# Patient Record
Sex: Male | Born: 1945
Health system: Southern US, Community
[De-identification: ages and names within clinical notes are randomized; demographics above are authoritative.]

---

## 2012-02-09 DIAGNOSIS — E039 Hypothyroidism, unspecified: Secondary | ICD-10-CM | POA: Diagnosis not present

## 2012-02-09 DIAGNOSIS — R7309 Other abnormal glucose: Secondary | ICD-10-CM | POA: Diagnosis not present

## 2012-02-09 DIAGNOSIS — E782 Mixed hyperlipidemia: Secondary | ICD-10-CM | POA: Diagnosis not present

## 2012-02-13 DIAGNOSIS — R030 Elevated blood-pressure reading, without diagnosis of hypertension: Secondary | ICD-10-CM | POA: Diagnosis not present

## 2012-02-13 DIAGNOSIS — E039 Hypothyroidism, unspecified: Secondary | ICD-10-CM | POA: Diagnosis not present

## 2012-02-13 DIAGNOSIS — J309 Allergic rhinitis, unspecified: Secondary | ICD-10-CM | POA: Diagnosis not present

## 2012-02-13 DIAGNOSIS — E782 Mixed hyperlipidemia: Secondary | ICD-10-CM | POA: Diagnosis not present

## 2012-02-13 DIAGNOSIS — K219 Gastro-esophageal reflux disease without esophagitis: Secondary | ICD-10-CM | POA: Diagnosis not present

## 2012-02-13 DIAGNOSIS — R7309 Other abnormal glucose: Secondary | ICD-10-CM | POA: Diagnosis not present

## 2012-02-13 DIAGNOSIS — M1990 Unspecified osteoarthritis, unspecified site: Secondary | ICD-10-CM | POA: Diagnosis not present

## 2012-03-18 DIAGNOSIS — I491 Atrial premature depolarization: Secondary | ICD-10-CM | POA: Diagnosis not present

## 2012-03-18 DIAGNOSIS — R7309 Other abnormal glucose: Secondary | ICD-10-CM | POA: Diagnosis not present

## 2012-03-18 DIAGNOSIS — I10 Essential (primary) hypertension: Secondary | ICD-10-CM | POA: Diagnosis not present

## 2012-03-18 DIAGNOSIS — E039 Hypothyroidism, unspecified: Secondary | ICD-10-CM | POA: Diagnosis not present

## 2012-03-18 DIAGNOSIS — J309 Allergic rhinitis, unspecified: Secondary | ICD-10-CM | POA: Diagnosis not present

## 2012-03-18 DIAGNOSIS — M1990 Unspecified osteoarthritis, unspecified site: Secondary | ICD-10-CM | POA: Diagnosis not present

## 2012-03-18 DIAGNOSIS — E782 Mixed hyperlipidemia: Secondary | ICD-10-CM | POA: Diagnosis not present

## 2012-04-20 DIAGNOSIS — H401390 Pigmentary glaucoma, unspecified eye, stage unspecified: Secondary | ICD-10-CM | POA: Diagnosis not present

## 2012-04-20 DIAGNOSIS — H2510 Age-related nuclear cataract, unspecified eye: Secondary | ICD-10-CM | POA: Diagnosis not present

## 2012-04-22 DIAGNOSIS — I10 Essential (primary) hypertension: Secondary | ICD-10-CM | POA: Diagnosis not present

## 2012-05-03 DIAGNOSIS — H401390 Pigmentary glaucoma, unspecified eye, stage unspecified: Secondary | ICD-10-CM | POA: Diagnosis not present

## 2012-05-03 DIAGNOSIS — H2510 Age-related nuclear cataract, unspecified eye: Secondary | ICD-10-CM | POA: Diagnosis not present

## 2012-05-04 DIAGNOSIS — B029 Zoster without complications: Secondary | ICD-10-CM | POA: Diagnosis not present

## 2012-08-31 DIAGNOSIS — Z23 Encounter for immunization: Secondary | ICD-10-CM | POA: Diagnosis not present

## 2012-08-31 DIAGNOSIS — E782 Mixed hyperlipidemia: Secondary | ICD-10-CM | POA: Diagnosis not present

## 2012-08-31 DIAGNOSIS — I10 Essential (primary) hypertension: Secondary | ICD-10-CM | POA: Diagnosis not present

## 2012-08-31 DIAGNOSIS — E039 Hypothyroidism, unspecified: Secondary | ICD-10-CM | POA: Diagnosis not present

## 2012-08-31 DIAGNOSIS — M6530 Trigger finger, unspecified finger: Secondary | ICD-10-CM | POA: Diagnosis not present

## 2012-08-31 DIAGNOSIS — N402 Nodular prostate without lower urinary tract symptoms: Secondary | ICD-10-CM | POA: Diagnosis not present

## 2012-08-31 DIAGNOSIS — Z0000 Encounter for general adult medical examination without abnormal findings: Secondary | ICD-10-CM | POA: Diagnosis not present

## 2012-08-31 DIAGNOSIS — R7309 Other abnormal glucose: Secondary | ICD-10-CM | POA: Diagnosis not present

## 2012-08-31 DIAGNOSIS — K219 Gastro-esophageal reflux disease without esophagitis: Secondary | ICD-10-CM | POA: Diagnosis not present

## 2012-11-10 DIAGNOSIS — H2510 Age-related nuclear cataract, unspecified eye: Secondary | ICD-10-CM | POA: Diagnosis not present

## 2012-11-10 DIAGNOSIS — H401390 Pigmentary glaucoma, unspecified eye, stage unspecified: Secondary | ICD-10-CM | POA: Diagnosis not present

## 2012-12-07 DIAGNOSIS — H269 Unspecified cataract: Secondary | ICD-10-CM | POA: Diagnosis not present

## 2012-12-07 DIAGNOSIS — H2510 Age-related nuclear cataract, unspecified eye: Secondary | ICD-10-CM | POA: Diagnosis not present

## 2012-12-15 DIAGNOSIS — H2510 Age-related nuclear cataract, unspecified eye: Secondary | ICD-10-CM | POA: Diagnosis not present

## 2012-12-27 DIAGNOSIS — H2510 Age-related nuclear cataract, unspecified eye: Secondary | ICD-10-CM | POA: Diagnosis not present

## 2012-12-27 DIAGNOSIS — H401390 Pigmentary glaucoma, unspecified eye, stage unspecified: Secondary | ICD-10-CM | POA: Diagnosis not present

## 2013-01-07 DIAGNOSIS — H269 Unspecified cataract: Secondary | ICD-10-CM | POA: Diagnosis not present

## 2013-01-07 DIAGNOSIS — H2510 Age-related nuclear cataract, unspecified eye: Secondary | ICD-10-CM | POA: Diagnosis not present

## 2013-01-07 DIAGNOSIS — H52209 Unspecified astigmatism, unspecified eye: Secondary | ICD-10-CM | POA: Diagnosis not present

## 2013-03-01 DIAGNOSIS — I10 Essential (primary) hypertension: Secondary | ICD-10-CM | POA: Diagnosis not present

## 2013-03-01 DIAGNOSIS — M1990 Unspecified osteoarthritis, unspecified site: Secondary | ICD-10-CM | POA: Diagnosis not present

## 2013-03-01 DIAGNOSIS — J309 Allergic rhinitis, unspecified: Secondary | ICD-10-CM | POA: Diagnosis not present

## 2013-03-01 DIAGNOSIS — E782 Mixed hyperlipidemia: Secondary | ICD-10-CM | POA: Diagnosis not present

## 2013-03-01 DIAGNOSIS — E039 Hypothyroidism, unspecified: Secondary | ICD-10-CM | POA: Diagnosis not present

## 2013-03-01 DIAGNOSIS — R7309 Other abnormal glucose: Secondary | ICD-10-CM | POA: Diagnosis not present

## 2013-03-01 DIAGNOSIS — K219 Gastro-esophageal reflux disease without esophagitis: Secondary | ICD-10-CM | POA: Diagnosis not present

## 2013-05-27 DIAGNOSIS — H401390 Pigmentary glaucoma, unspecified eye, stage unspecified: Secondary | ICD-10-CM | POA: Diagnosis not present

## 2013-05-27 DIAGNOSIS — H26499 Other secondary cataract, unspecified eye: Secondary | ICD-10-CM | POA: Diagnosis not present

## 2013-09-06 DIAGNOSIS — I10 Essential (primary) hypertension: Secondary | ICD-10-CM | POA: Diagnosis not present

## 2013-09-06 DIAGNOSIS — Z1331 Encounter for screening for depression: Secondary | ICD-10-CM | POA: Diagnosis not present

## 2013-09-06 DIAGNOSIS — Z125 Encounter for screening for malignant neoplasm of prostate: Secondary | ICD-10-CM | POA: Diagnosis not present

## 2013-09-06 DIAGNOSIS — E782 Mixed hyperlipidemia: Secondary | ICD-10-CM | POA: Diagnosis not present

## 2013-09-06 DIAGNOSIS — J309 Allergic rhinitis, unspecified: Secondary | ICD-10-CM | POA: Diagnosis not present

## 2013-09-06 DIAGNOSIS — R7309 Other abnormal glucose: Secondary | ICD-10-CM | POA: Diagnosis not present

## 2013-09-06 DIAGNOSIS — K219 Gastro-esophageal reflux disease without esophagitis: Secondary | ICD-10-CM | POA: Diagnosis not present

## 2013-09-06 DIAGNOSIS — Z0000 Encounter for general adult medical examination without abnormal findings: Secondary | ICD-10-CM | POA: Diagnosis not present

## 2013-09-06 DIAGNOSIS — Z23 Encounter for immunization: Secondary | ICD-10-CM | POA: Diagnosis not present

## 2013-09-06 DIAGNOSIS — E039 Hypothyroidism, unspecified: Secondary | ICD-10-CM | POA: Diagnosis not present

## 2013-09-06 DIAGNOSIS — M1990 Unspecified osteoarthritis, unspecified site: Secondary | ICD-10-CM | POA: Diagnosis not present

## 2013-10-27 DIAGNOSIS — H26499 Other secondary cataract, unspecified eye: Secondary | ICD-10-CM | POA: Diagnosis not present

## 2013-10-27 DIAGNOSIS — H43399 Other vitreous opacities, unspecified eye: Secondary | ICD-10-CM | POA: Diagnosis not present

## 2013-10-27 DIAGNOSIS — H401390 Pigmentary glaucoma, unspecified eye, stage unspecified: Secondary | ICD-10-CM | POA: Diagnosis not present

## 2013-11-07 DIAGNOSIS — S61209A Unspecified open wound of unspecified finger without damage to nail, initial encounter: Secondary | ICD-10-CM | POA: Diagnosis not present

## 2013-11-14 DIAGNOSIS — Z4802 Encounter for removal of sutures: Secondary | ICD-10-CM | POA: Diagnosis not present

## 2013-11-14 DIAGNOSIS — Z5189 Encounter for other specified aftercare: Secondary | ICD-10-CM | POA: Diagnosis not present

## 2014-03-27 DIAGNOSIS — I10 Essential (primary) hypertension: Secondary | ICD-10-CM | POA: Diagnosis not present

## 2014-03-27 DIAGNOSIS — R7309 Other abnormal glucose: Secondary | ICD-10-CM | POA: Diagnosis not present

## 2014-03-27 DIAGNOSIS — E039 Hypothyroidism, unspecified: Secondary | ICD-10-CM | POA: Diagnosis not present

## 2014-03-27 DIAGNOSIS — M1990 Unspecified osteoarthritis, unspecified site: Secondary | ICD-10-CM | POA: Diagnosis not present

## 2014-03-27 DIAGNOSIS — K219 Gastro-esophageal reflux disease without esophagitis: Secondary | ICD-10-CM | POA: Diagnosis not present

## 2014-03-27 DIAGNOSIS — J309 Allergic rhinitis, unspecified: Secondary | ICD-10-CM | POA: Diagnosis not present

## 2014-03-27 DIAGNOSIS — E782 Mixed hyperlipidemia: Secondary | ICD-10-CM | POA: Diagnosis not present

## 2014-04-26 DIAGNOSIS — H401390 Pigmentary glaucoma, unspecified eye, stage unspecified: Secondary | ICD-10-CM | POA: Diagnosis not present

## 2014-04-26 DIAGNOSIS — H43399 Other vitreous opacities, unspecified eye: Secondary | ICD-10-CM | POA: Diagnosis not present

## 2014-04-26 DIAGNOSIS — H26499 Other secondary cataract, unspecified eye: Secondary | ICD-10-CM | POA: Diagnosis not present

## 2014-05-26 DIAGNOSIS — J019 Acute sinusitis, unspecified: Secondary | ICD-10-CM | POA: Diagnosis not present

## 2014-05-26 DIAGNOSIS — Z87891 Personal history of nicotine dependence: Secondary | ICD-10-CM | POA: Diagnosis not present

## 2014-09-11 DIAGNOSIS — E782 Mixed hyperlipidemia: Secondary | ICD-10-CM | POA: Diagnosis not present

## 2014-09-11 DIAGNOSIS — M1711 Unilateral primary osteoarthritis, right knee: Secondary | ICD-10-CM | POA: Diagnosis not present

## 2014-09-11 DIAGNOSIS — I10 Essential (primary) hypertension: Secondary | ICD-10-CM | POA: Diagnosis not present

## 2014-09-11 DIAGNOSIS — K219 Gastro-esophageal reflux disease without esophagitis: Secondary | ICD-10-CM | POA: Diagnosis not present

## 2014-09-11 DIAGNOSIS — Z23 Encounter for immunization: Secondary | ICD-10-CM | POA: Diagnosis not present

## 2014-09-11 DIAGNOSIS — Z0000 Encounter for general adult medical examination without abnormal findings: Secondary | ICD-10-CM | POA: Diagnosis not present

## 2014-09-11 DIAGNOSIS — R7309 Other abnormal glucose: Secondary | ICD-10-CM | POA: Diagnosis not present

## 2014-09-11 DIAGNOSIS — Z1389 Encounter for screening for other disorder: Secondary | ICD-10-CM | POA: Diagnosis not present

## 2014-09-11 DIAGNOSIS — H409 Unspecified glaucoma: Secondary | ICD-10-CM | POA: Diagnosis not present

## 2014-09-11 DIAGNOSIS — E039 Hypothyroidism, unspecified: Secondary | ICD-10-CM | POA: Diagnosis not present

## 2014-10-02 DIAGNOSIS — H4011X1 Primary open-angle glaucoma, mild stage: Secondary | ICD-10-CM | POA: Diagnosis not present

## 2014-10-26 DIAGNOSIS — M9903 Segmental and somatic dysfunction of lumbar region: Secondary | ICD-10-CM | POA: Diagnosis not present

## 2014-10-26 DIAGNOSIS — M546 Pain in thoracic spine: Secondary | ICD-10-CM | POA: Diagnosis not present

## 2014-10-26 DIAGNOSIS — M5416 Radiculopathy, lumbar region: Secondary | ICD-10-CM | POA: Diagnosis not present

## 2014-10-26 DIAGNOSIS — M545 Low back pain: Secondary | ICD-10-CM | POA: Diagnosis not present

## 2014-10-30 DIAGNOSIS — M5416 Radiculopathy, lumbar region: Secondary | ICD-10-CM | POA: Diagnosis not present

## 2014-10-30 DIAGNOSIS — M9903 Segmental and somatic dysfunction of lumbar region: Secondary | ICD-10-CM | POA: Diagnosis not present

## 2014-10-30 DIAGNOSIS — M546 Pain in thoracic spine: Secondary | ICD-10-CM | POA: Diagnosis not present

## 2014-10-30 DIAGNOSIS — M545 Low back pain: Secondary | ICD-10-CM | POA: Diagnosis not present

## 2014-11-02 DIAGNOSIS — M546 Pain in thoracic spine: Secondary | ICD-10-CM | POA: Diagnosis not present

## 2014-11-02 DIAGNOSIS — M5416 Radiculopathy, lumbar region: Secondary | ICD-10-CM | POA: Diagnosis not present

## 2014-11-02 DIAGNOSIS — M9903 Segmental and somatic dysfunction of lumbar region: Secondary | ICD-10-CM | POA: Diagnosis not present

## 2014-11-02 DIAGNOSIS — M545 Low back pain: Secondary | ICD-10-CM | POA: Diagnosis not present

## 2014-11-09 DIAGNOSIS — M5416 Radiculopathy, lumbar region: Secondary | ICD-10-CM | POA: Diagnosis not present

## 2014-11-09 DIAGNOSIS — M545 Low back pain: Secondary | ICD-10-CM | POA: Diagnosis not present

## 2014-11-09 DIAGNOSIS — M546 Pain in thoracic spine: Secondary | ICD-10-CM | POA: Diagnosis not present

## 2014-11-09 DIAGNOSIS — M9903 Segmental and somatic dysfunction of lumbar region: Secondary | ICD-10-CM | POA: Diagnosis not present

## 2015-03-19 DIAGNOSIS — K219 Gastro-esophageal reflux disease without esophagitis: Secondary | ICD-10-CM | POA: Diagnosis not present

## 2015-03-19 DIAGNOSIS — E782 Mixed hyperlipidemia: Secondary | ICD-10-CM | POA: Diagnosis not present

## 2015-03-19 DIAGNOSIS — M1711 Unilateral primary osteoarthritis, right knee: Secondary | ICD-10-CM | POA: Diagnosis not present

## 2015-03-19 DIAGNOSIS — J309 Allergic rhinitis, unspecified: Secondary | ICD-10-CM | POA: Diagnosis not present

## 2015-03-19 DIAGNOSIS — I10 Essential (primary) hypertension: Secondary | ICD-10-CM | POA: Diagnosis not present

## 2015-03-19 DIAGNOSIS — R7309 Other abnormal glucose: Secondary | ICD-10-CM | POA: Diagnosis not present

## 2015-03-19 DIAGNOSIS — Z1389 Encounter for screening for other disorder: Secondary | ICD-10-CM | POA: Diagnosis not present

## 2015-03-19 DIAGNOSIS — H409 Unspecified glaucoma: Secondary | ICD-10-CM | POA: Diagnosis not present

## 2015-03-19 DIAGNOSIS — M25511 Pain in right shoulder: Secondary | ICD-10-CM | POA: Diagnosis not present

## 2015-03-19 DIAGNOSIS — E039 Hypothyroidism, unspecified: Secondary | ICD-10-CM | POA: Diagnosis not present

## 2015-03-20 DIAGNOSIS — H4011X1 Primary open-angle glaucoma, mild stage: Secondary | ICD-10-CM | POA: Diagnosis not present

## 2015-04-19 DIAGNOSIS — D126 Benign neoplasm of colon, unspecified: Secondary | ICD-10-CM | POA: Diagnosis not present

## 2015-04-19 DIAGNOSIS — K640 First degree hemorrhoids: Secondary | ICD-10-CM | POA: Diagnosis not present

## 2015-04-19 DIAGNOSIS — Z09 Encounter for follow-up examination after completed treatment for conditions other than malignant neoplasm: Secondary | ICD-10-CM | POA: Diagnosis not present

## 2015-04-19 DIAGNOSIS — Z86010 Personal history of colonic polyps: Secondary | ICD-10-CM | POA: Diagnosis not present

## 2015-04-19 DIAGNOSIS — D122 Benign neoplasm of ascending colon: Secondary | ICD-10-CM | POA: Diagnosis not present

## 2015-05-07 ENCOUNTER — Encounter: Payer: Self-pay | Admitting: Physical Therapy

## 2015-05-07 ENCOUNTER — Ambulatory Visit: Payer: Medicare Other | Attending: Family Medicine | Admitting: Physical Therapy

## 2015-05-07 DIAGNOSIS — M67911 Unspecified disorder of synovium and tendon, right shoulder: Secondary | ICD-10-CM | POA: Insufficient documentation

## 2015-05-07 DIAGNOSIS — M7541 Impingement syndrome of right shoulder: Secondary | ICD-10-CM

## 2015-05-07 DIAGNOSIS — R29898 Other symptoms and signs involving the musculoskeletal system: Secondary | ICD-10-CM | POA: Insufficient documentation

## 2015-05-07 DIAGNOSIS — M25511 Pain in right shoulder: Secondary | ICD-10-CM | POA: Insufficient documentation

## 2015-05-07 NOTE — Therapy (Signed)
Philip Garner, [place], [address] Phone: [phone]   Fax:  [phone]  Physical Therapy Evaluation  Patient Details  Name: Philip Garner MRN: [id number] Date of Birth: 1946/02/18 Referring Provider:  Antony Contras, MD  Encounter Date: 05/07/2015      PT End of Session - 05/07/15 0944    Visit Number 1   Date for PT Re-Evaluation 07/09/15   PT Start Time 0840   PT Stop Time 0933   PT Time Calculation (min) 53 min   Activity Tolerance Patient tolerated treatment well   Behavior During Therapy [facility] for tasks assessed/performed      History reviewed. No pertinent past medical history.  History reviewed. No pertinent past surgical history.  There were no vitals filed for this visit.  Visit Diagnosis:  Pain in joint, shoulder region, right - Plan: PT plan of care cert/re-cert  Weakness of shoulder - Plan: PT plan of care cert/re-cert  Tendinopathy of right rotator cuff - Plan: PT plan of care cert/re-cert  Shoulder impingement syndrome, right - Plan: PT plan of care cert/re-cert      Subjective Assessment - 05/07/15 0845    Subjective Pt states back in March he was using a jack hammer to dig steel post holes and his shoulder started to bother him.  He didn't do much about it but at the beginning of April he was playing golf 3x/week and it started bother him again and has since kept getting worse and worse.     Pertinent History "dislocated clavicle 73yrs ago without fixation"   Limitations Lifting;House hold activities   Patient Stated Goals Decrease pain, get full use of shoulder, play golf pain free   Currently in Pain? Yes   Pain Score 1    Pain Location Shoulder   Pain Orientation Right   Pain Descriptors / Indicators Aching   Pain Type Chronic pain   Pain Onset More than a month ago   Pain Frequency Intermittent   Aggravating Factors  lifting, on my phone at night in bed, golfing,  reaching across my chest,    Pain Relieving Factors putting pillows under arm, aleve, ice, heat             [facility] PT Assessment - 05/07/15 0001    Assessment   Medical Diagnosis R shoulder pain   Onset Date/Surgical Date 01/02/15   Hand Dominance Right   Prior Therapy no   Balance Screen   Has the patient fallen in the past 6 months Yes   How many times? 1   Has the patient had a decrease in activity level because of a fear of falling?  No   Is the patient reluctant to leave their home because of a fear of falling?  No   Prior Function   Level of Independence Independent   Vocation Retired   Leisure golfing, sailing,    Observation/Other Assessments   Observations Scapula normal with abd/flex, clavical raised on R side with decreased supraspinatus muscle belly.     ROM / Strength   AROM / PROM / Strength AROM;Strength   AROM   Overall AROM Comments painful arc in abd on R, pain approx 80 degrees-120 degrees, flexion full ROM, decreased pain  aching, 4/10   Strength   Overall Strength Comments R-abd 4+/5, flex 4+/5, ER 4+/5 (all due to pain), ext/IR 5/5   Palpation   Palpation comment TTP supraspinatus attachment, non tender biceps tendon,  resisted abd sliight tenderness w/activation, decreased muscle belly in scapular fossa   Special Tests    Special Tests Rotator Cuff Impingement   Rotator Cuff Impingment tests other   Hawkins-Kennedy test   Findings --   Side --   Comments --   Empty Can test   Findings Positive   Side Right   Comment pain   Speed's test   Findings Negative   Side Right   Comment pain near supraspinatus tendon not biceps tendon   other   Findings Negative  yocum's   Side Right   Comments pain initiallly then goes away past 90                   [facility] Adult PT Treatment/Exercise - 05/07/15 0001    Modalities   Modalities Electrical Stimulation;Iontophoresis   Moist Heat Therapy   Number Minutes Moist Heat 15 Minutes   Moist Heat  Location Shoulder   Electrical Stimulation   Electrical Stimulation Location R shoulder   Electrical Stimulation Action IFC   Electrical Stimulation Parameters to pain tolerance   Electrical Stimulation Goals Pain                PT Education - 05/07/15 [phone]    Education provided Yes   Education Details Provided pt education on posture as well as scapular strengthening exercises and instructed pt to remove dexa patch at 1:30pm 05/07/15   Person(s) Educated Patient   Methods Explanation;Demonstration;Tactile cues;Verbal cues;Handout   Comprehension Verbalized understanding;Returned demonstration;Verbal cues required;Tactile cues required          PT Short Term Goals - 05/07/15 0953    PT SHORT TERM GOAL #1   Title Pt will be ind with HEP   Time 2   Period Weeks   Status New           PT Long Term Goals - 05/07/15 1287    PT LONG TERM GOAL #1   Title pt will be able to play 18 holes golf with 75% decreased pain   Time 8   Period Weeks   Status New   PT LONG TERM GOAL #2   Title Pt will increase R shoulder strength 5/5   Time 8   Period Weeks   Status New   PT LONG TERM GOAL #3   Title Pt will have full ROM with pain decreased 75% at 80-120 degrees.    Time 8   Period Weeks   Status New               Plan - 05/07/15 0944    Clinical Impression Statement pt presents to outpatient ortho complaining of R shoulder pain.  In March pt was jack hammering to put in post holes for a fence and started to have aching shoulder pain around supraspinatus attachment on lateral shoulder as well as aching down into the deltoid.  Pt has painful arc of motion in abduction 80-120 degrees, painful resisted ER/abd. Pt has previous hx of clavicular dislocation where a prominent clavicle is noted with decreased supraspinatus muscle belly possibly due to the elevation of the clavicle.  Pt has good strength and ROM however he is limited by pain in abd/ER/horizontal ADD.  Pt is  presenting as possible supraspinatus tendinitis along with impingement.  Pt was given dexamethasone patch at 9:30am with instructions to remove at 1:30pm unless he is having burning/discomfort then remove immediately   Pt will benefit from skilled therapeutic intervention in order to improve  on the following deficits Decreased activity tolerance;Decreased range of motion;Decreased strength;Hypomobility;Pain;Postural dysfunction   Rehab Potential Good   PT Frequency 2x / week   PT Duration 8 weeks   PT Treatment/Interventions ADLs/Self Care Home Management;Cryotherapy;Electrical Stimulation;Iontophoresis 4mg /ml Dexamethasone;Ultrasound;Moist Heat;Therapeutic activities;Therapeutic exercise;Neuromuscular re-education;Patient/family education;Manual techniques;Passive range of motion;Taping   PT Next Visit Plan work on gentle strengthening exercises for shoulder, modalities PRN, dexa, manual therapy   PT Home Exercise Plan posture education, scapular stabilization   Consulted and Agree with Plan of Care Patient          G-Codes - 05/15/15 1100    Functional Assessment Tool Used foto   Functional Limitation Other PT primary   Other PT Primary Current Status (S9233) At least 40 percent but less than 60 percent impaired, limited or restricted   Other PT Primary Goal Status (A0762) At least 20 percent but less than 40 percent impaired, limited or restricted       Problem List There are no active problems to display for this patient.   Sumner Boast., PT 05-15-2015, 2:10 PM  [address] [place], [place], [address] Phone: [phone]   Fax:  [phone]

## 2015-05-09 ENCOUNTER — Ambulatory Visit: Payer: Medicare Other | Admitting: Physical Therapy

## 2015-05-09 ENCOUNTER — Encounter: Payer: Self-pay | Admitting: Physical Therapy

## 2015-05-09 DIAGNOSIS — M67911 Unspecified disorder of synovium and tendon, right shoulder: Secondary | ICD-10-CM | POA: Diagnosis not present

## 2015-05-09 DIAGNOSIS — M25511 Pain in right shoulder: Secondary | ICD-10-CM

## 2015-05-09 DIAGNOSIS — M7541 Impingement syndrome of right shoulder: Secondary | ICD-10-CM | POA: Diagnosis not present

## 2015-05-09 DIAGNOSIS — R29898 Other symptoms and signs involving the musculoskeletal system: Secondary | ICD-10-CM | POA: Diagnosis not present

## 2015-05-09 NOTE — Therapy (Signed)
[place], [place], [address] Phone: [phone]   Fax:  [phone]  Physical Therapy Treatment  Patient Details  Name: Philip Garner MRN: [id number] Date of Birth: March 30, 1946 Referring Provider:  Antony Contras, MD  Encounter Date: 05/09/2015      PT End of Session - 05/09/15 1024    Visit Number 2   Date for PT Re-Evaluation 07/09/15   PT Start Time 0930   PT Stop Time 1010   PT Time Calculation (min) 40 min   Activity Tolerance Patient tolerated treatment well   Behavior During Therapy [facility] for tasks assessed/performed      History reviewed. No pertinent past medical history.  History reviewed. No pertinent past surgical history.  There were no vitals filed for this visit.  Visit Diagnosis:  Pain in joint, shoulder region, right  Weakness of shoulder  Tendinopathy of right rotator cuff  Shoulder impingement syndrome, right      Subjective Assessment - 05/09/15 0929    Subjective Pt states after last tx he was in the car for 3-4 hours so I didn't feel the patch doing much because I was in the car.  I helped my son unload a trailer and that was ok becauase it was light stuff.  I did my exercises this morning though.  My most trouble is sleeping at night and sitting in bed on my phone or ipad where it is in front of me and I feel the pain then.  I have started to put a pillow underneath my arm at night.     Currently in Pain? No/denies                         [facility] Adult PT Treatment/Exercise - 05/09/15 0001    Lumbar Exercises: Aerobic   UBE (Upper Arm Bike) 6 min foward/back L 4   Shoulder Exercises: Standing   External Rotation Strengthening;Both;12 reps  2 sets   Theraband Level (Shoulder External Rotation) Level 2 (Red)  towel in axilla   Extension Strengthening;Both;12 reps  2 sets   Theraband Level (Shoulder Extension) Level 2 (Red);Level 3 (Green)   Row  Strengthening;Both;12 reps  2 sets, VC for form   Row Limitations red 1 set, green 2nd   Shoulder Elevation Strengthening;Both  12 reps 2 sets   Shoulder Elevation Limitations 2#   Other Standing Exercises Rolls forward/backward 2# 2 sets, 12 reps   Other Standing Exercises inf pushes into green ball 2 x 10   Manual Therapy   Manual Therapy Manual Traction;Passive ROM   Joint Mobilization clavicular mobes-grade 2, T4-T7 PA/unilateral mobes bilaterally  good movement with clavicle, tight T4-T7   Passive ROM abd/ER/IR/Flex  pt tight in IR R shoulder   Manual Traction distraction with ER/IR movements 4 x 30 sec  R shoulder                PT Education - 05/09/15 1023    Education provided Yes   Education Details thoracic rotation on chair and self thoracic mobes   Person(s) Educated Patient   Methods Explanation;Demonstration   Comprehension Verbalized understanding;Returned demonstration          PT Short Term Goals - 05/07/15 0953    PT SHORT TERM GOAL #1   Title Pt will be ind with HEP   Time 2   Period Weeks   Status New  PT Long Term Goals - 05/07/15 0953    PT LONG TERM GOAL #1   Title pt will be able to play 18 holes golf with 75% decreased pain   Time 8   Period Weeks   Status New   PT LONG TERM GOAL #2   Title Pt will increase R shoulder strength 5/5   Time 8   Period Weeks   Status New   PT LONG TERM GOAL #3   Title Pt will have full ROM with pain decreased 75% at 80-120 degrees.    Time 8   Period Weeks   Status New               Plan - 05/09/15 1025    Clinical Impression Statement Pt did well with treatment and denied pain with exercises.  Pt needs VCueing on form with standing shoulder exercises.  Pt tight in thoracic area T7-T7 on PA mobes.     Rehab Potential Good   PT Frequency 2x / week   PT Duration 8 weeks   PT Next Visit Plan work on gentle strengthening exercises for shoulder, modalities PRN, dexa, manual  therapy   PT Home Exercise Plan posture education, scapular stabilization   Consulted and Agree with Plan of Care Patient        Problem List There are no active problems to display for this patient.   Sumner Boast,. PT 05/09/2015, 10:40 AM  [address] [place], [place], [address] Phone: [phone]   Fax:  [phone]

## 2015-05-15 ENCOUNTER — Ambulatory Visit: Payer: Medicare Other | Admitting: Physical Therapy

## 2015-05-15 DIAGNOSIS — M67911 Unspecified disorder of synovium and tendon, right shoulder: Secondary | ICD-10-CM

## 2015-05-15 DIAGNOSIS — R29898 Other symptoms and signs involving the musculoskeletal system: Secondary | ICD-10-CM | POA: Diagnosis not present

## 2015-05-15 DIAGNOSIS — M7541 Impingement syndrome of right shoulder: Secondary | ICD-10-CM

## 2015-05-15 DIAGNOSIS — M25511 Pain in right shoulder: Secondary | ICD-10-CM

## 2015-05-15 NOTE — Therapy (Signed)
[place], [place], [address] Phone: [phone]   Fax:  [phone]  Physical Therapy Treatment  Patient Details  Name: Philip Garner MRN: [id number] Date of Birth: 06/20/1946 Referring Provider:  Antony Contras, MD  Encounter Date: 05/15/2015      PT End of Session - 05/15/15 1349    Visit Number 3   PT Start Time 1245   PT Stop Time 1402   PT Time Calculation (min) 34 min   Activity Tolerance Patient tolerated treatment well   Behavior During Therapy [facility] for tasks assessed/performed      No past medical history on file.  No past surgical history on file.  There were no vitals filed for this visit.  Visit Diagnosis:  Pain in joint, shoulder region, right  Weakness of shoulder  Tendinopathy of right rotator cuff  Shoulder impingement syndrome, right      Subjective Assessment - 05/15/15 1326    Subjective Pt states he played golf this morning and yesterday.  The shoulder felt ok, just discomfort after playing so I put heat on it and it felt better.  The shoulder has been feeling better than it has.  Just discomfort.     Currently in Pain? No/denies   Pain Descriptors / Indicators Discomfort                         [facility] Adult PT Treatment/Exercise - 05/15/15 0001    Lumbar Exercises: Aerobic   UBE (Upper Arm Bike) 6 min foward/back constant work 30 watts   Shoulder Exercises: Standing   External Rotation Strengthening;Both;12 reps   Theraband Level (Shoulder External Rotation) Level 3 (Green)   Internal Rotation Strengthening;Right;12 reps  2 sets   Theraband Level (Shoulder Internal Rotation) Level 3 (Green)   Extension Strengthening;Both;12 reps   Theraband Level (Shoulder Extension) Level 4 (Blue)   Row Strengthening;Both;12 reps  2 sets   Row Weight (lbs) 25#, 35#   Shoulder Elevation Strengthening;Both   Shoulder Elevation Limitations 4#   Other  Standing Exercises Lat pull downs, 2 sets, 12 reps, 35#   Other Standing Exercises inf pushes into green ball 2 x 10   Manual Therapy   Manual Therapy Manual Traction;Passive ROM   Joint Mobilization T4-7 PA, unilateral mobes   Passive ROM abd/ER/IR/Flex   Manual Traction distraction with ER/IR movements 4 x 30 sec                  PT Short Term Goals - 05/07/15 8099    PT SHORT TERM GOAL #1   Title Pt will be ind with HEP   Time 2   Period Weeks   Status New           PT Long Term Goals - 05/07/15 8338    PT LONG TERM GOAL #1   Title pt will be able to play 18 holes golf with 75% decreased pain   Time 8   Period Weeks   Status New   PT LONG TERM GOAL #2   Title Pt will increase R shoulder strength 5/5   Time 8   Period Weeks   Status New   PT LONG TERM GOAL #3   Title Pt will have full ROM with pain decreased 75% at 80-120 degrees.    Time 8   Period Weeks   Status New  Plan - 05/15/15 1404    Clinical Impression Statement Pt reports improvement and decreased pain.  pt was 10 min late for treatment so we had a shorter session.  Continued with scapular stabilization as well as joint mobes and PROM of shoulder   Pt will benefit from skilled therapeutic intervention in order to improve on the following deficits Decreased activity tolerance;Decreased range of motion;Decreased strength;Hypomobility;Pain;Postural dysfunction   Rehab Potential Good   PT Frequency 2x / week   PT Duration 8 weeks   PT Treatment/Interventions ADLs/Self Care Home Management;Cryotherapy;Electrical Stimulation;Iontophoresis 4mg /ml Dexamethasone;Ultrasound;Moist Heat;Therapeutic activities;Therapeutic exercise;Neuromuscular re-education;Patient/family education;Manual techniques;Passive range of motion;Taping   PT Next Visit Plan PROM end range, strength, modalities, manual therapy   PT Home Exercise Plan posture education, scapular stabilization, thoracic self mobes    Consulted and Agree with Plan of Care Patient        Problem List There are no active problems to display for this patient.   Alda Berthold 05/15/2015, 2:06 PM  [address] [place], [place], [address] Phone: [phone]   Fax:  [phone]

## 2015-05-17 ENCOUNTER — Ambulatory Visit: Payer: Medicare Other | Admitting: Physical Therapy

## 2015-05-17 ENCOUNTER — Encounter: Payer: Self-pay | Admitting: Physical Therapy

## 2015-05-17 DIAGNOSIS — M67911 Unspecified disorder of synovium and tendon, right shoulder: Secondary | ICD-10-CM | POA: Diagnosis not present

## 2015-05-17 DIAGNOSIS — M25511 Pain in right shoulder: Secondary | ICD-10-CM

## 2015-05-17 DIAGNOSIS — M7541 Impingement syndrome of right shoulder: Secondary | ICD-10-CM | POA: Diagnosis not present

## 2015-05-17 DIAGNOSIS — R29898 Other symptoms and signs involving the musculoskeletal system: Secondary | ICD-10-CM | POA: Diagnosis not present

## 2015-05-17 NOTE — Therapy (Signed)
[place], [place], [address] Phone: [phone]   Fax:  [phone]  Physical Therapy Treatment  Patient Details  Name: Philip Garner MRN: [id number] Date of Birth: 1946-03-04 Referring Provider:  Antony Contras, MD  Encounter Date: 05/17/2015      PT End of Session - 05/17/15 1009    Visit Number 4   Date for PT Re-Evaluation 07/09/15   PT Start Time 0932   PT Stop Time 1007   PT Time Calculation (min) 35 min   Activity Tolerance Patient tolerated treatment well   Behavior During Therapy [facility] for tasks assessed/performed      History reviewed. No pertinent past medical history.  History reviewed. No pertinent past surgical history.  There were no vitals filed for this visit.  Visit Diagnosis:  Pain in joint, shoulder region, right  Tendinopathy of right rotator cuff  Shoulder impingement syndrome, right      Subjective Assessment - 05/17/15 0931    Subjective Pt states he is doing pretty good.  Yesterday it was a little sore but I slept on it.     Currently in Pain? No/denies                         [facility] Adult PT Treatment/Exercise - 05/17/15 0001    Lumbar Exercises: Aerobic   UBE (Upper Arm Bike) 6 min foward/back constant work 30 watts   Shoulder Exercises: Standing   External Rotation Strengthening;Both;12 reps  2 sets   Theraband Level (Shoulder External Rotation) --  5lbs pulley   Internal Rotation Strengthening;Both;12 reps  2 sets   Theraband Level (Shoulder Internal Rotation) --  5lbs-pulley   Extension Strengthening;Both;12 reps  2 sets   Extension Weight (lbs) 10#   Row Strengthening;Both;12 reps  2 reps   Row Weight (lbs) 35#   Shoulder Elevation Strengthening;Both  2 sets, rolls backward, 2 sets, 12 reps   Shoulder Elevation Limitations 8#   Other Standing Exercises Lat pull downs, 2 sets, 12 reps, 35#   Other Standing Exercises inf  pushes with pulley, 10lbs, 2 sets, 12 reps   Manual Therapy   Manual Therapy Manual Traction;Passive ROM   Joint Mobilization T1-T9 PA, unilateral mobilizations, scapular mobilizations   Passive ROM abd/ER/IR/Flex   Manual Traction distraction with ER/IR movements 4 x 30 sec                PT Education - 05/17/15 1009    Education provided Yes   Education Details continue with HEP   Person(s) Educated Patient   Methods Explanation   Comprehension Verbalized understanding          PT Short Term Goals - 05/07/15 0953    PT SHORT TERM GOAL #1   Title Pt will be ind with HEP   Time 2   Period Weeks   Status New           PT Long Term Goals - 05/07/15 8338    PT LONG TERM GOAL #1   Title pt will be able to play 18 holes golf with 75% decreased pain   Time 8   Period Weeks   Status New   PT LONG TERM GOAL #2   Title Pt will increase R shoulder strength 5/5   Time 8   Period Weeks   Status New   PT LONG TERM GOAL #3   Title Pt will have full  ROM with pain decreased 75% at 80-120 degrees.    Time 8   Period Weeks   Status New               Plan - 05/17/15 1010    Clinical Impression Statement Pt is continuing to do well with increased weight in exercises and reports feeling better.  Pt has some decreased spinal mobilization at T3-T7.  Pt is schedule for two visits next week if continued improvements are seen consider decreasing to 1x/wk and then discharge.      Pt will benefit from skilled therapeutic intervention in order to improve on the following deficits Decreased activity tolerance;Decreased range of motion;Decreased strength;Hypomobility;Pain;Postural dysfunction   Rehab Potential Good   PT Frequency 2x / week   PT Duration 8 weeks   PT Treatment/Interventions ADLs/Self Care Home Management;Cryotherapy;Electrical Stimulation;Iontophoresis 4mg /ml Dexamethasone;Ultrasound;Moist Heat;Therapeutic activities;Therapeutic exercise;Neuromuscular  re-education;Patient/family education;Manual techniques;Passive range of motion;Taping   PT Next Visit Plan PROM end range, strength, modalities, manual therapy   PT Home Exercise Plan HEP   Consulted and Agree with Plan of Care Patient        Problem List There are no active problems to display for this patient.   Sumner Boast., PT 05/17/2015, 3:56 PM  [address] [place], [place], [address] Phone: [phone]   Fax:  [phone]

## 2015-05-22 ENCOUNTER — Ambulatory Visit: Payer: Medicare Other | Admitting: Physical Therapy

## 2015-05-22 ENCOUNTER — Encounter: Payer: Self-pay | Admitting: Physical Therapy

## 2015-05-22 DIAGNOSIS — R29898 Other symptoms and signs involving the musculoskeletal system: Secondary | ICD-10-CM

## 2015-05-22 DIAGNOSIS — M7541 Impingement syndrome of right shoulder: Secondary | ICD-10-CM | POA: Diagnosis not present

## 2015-05-22 DIAGNOSIS — M25511 Pain in right shoulder: Secondary | ICD-10-CM

## 2015-05-22 DIAGNOSIS — M67911 Unspecified disorder of synovium and tendon, right shoulder: Secondary | ICD-10-CM | POA: Diagnosis not present

## 2015-05-22 NOTE — Therapy (Signed)
[place], [place], [address] Phone: [phone]   Fax:  [phone]  Physical Therapy Treatment  Patient Details  Name: Philip Garner MRN: [id number] Date of Birth: [birth year] Referring Provider:  Antony Contras, MD  Encounter Date: 05/22/2015      PT End of Session - 05/22/15 1003    Visit Number 5   Date for PT Re-Evaluation 07/09/15   PT Start Time 0921   PT Stop Time 1004   PT Time Calculation (min) 43 min   Activity Tolerance Patient tolerated treatment well   Behavior During Therapy [facility] for tasks assessed/performed      History reviewed. No pertinent past medical history.  History reviewed. No pertinent past surgical history.  There were no vitals filed for this visit.  Visit Diagnosis:  Pain in joint, shoulder region, right  Weakness of shoulder                       [facility] Adult PT Treatment/Exercise - 05/22/15 0001    Lumbar Exercises: Aerobic   UBE (Upper Arm Bike) 6 min foward/back constant work 30 watts   Shoulder Exercises: Standing   Horizontal ABduction 15 reps;Theraband;Both  2 sets    Theraband Level (Shoulder Horizontal ABduction) Level 3 (Green)   External Rotation Strengthening;Both;15 reps  2 sets    Theraband Level (Shoulder External Rotation) Level 3 (Green)   Internal Rotation 15 reps;Strengthening;Both  2 sets    Theraband Level (Shoulder Internal Rotation) Level 3 (Green)   Flexion Both;15 reps;Weights  2 sets   Shoulder Flexion Weight (lbs) 5   Row Strengthening;Both;15 reps  2 sets    Row Weight (lbs) 35   Shoulder Elevation 10 reps  3 sets   Shoulder Elevation Limitations #9   Other Standing Exercises Lat pull downs, 2 sets, 15 reps, 35#   Other Standing Exercises standing bicep curl #7 15 reps 2 sets; standing straight arm pull downs #35 10 reps 3 sets ; wall push ups 15 reps 2 sets                   PT Short Term  Goals - 05/22/15 1005    PT SHORT TERM GOAL #1   Title Pt will be ind with HEP   Status Achieved           PT Long Term Goals - 05/22/15 1005    PT LONG TERM GOAL #3   Title Pt will have full ROM with pain decreased 75% at 80-120 degrees.    Status Partially Met               Plan - 05/22/15 1004    Clinical Impression Statement Pt able to tolerated additional exercises with increased weight. Pt reports that he heel better and he fell like his shoulder is getting stronger.   Pt will benefit from skilled therapeutic intervention in order to improve on the following deficits Decreased activity tolerance;Decreased range of motion;Decreased strength;Hypomobility;Pain;Postural dysfunction   PT Frequency 2x / week   PT Duration 8 weeks   PT Treatment/Interventions ADLs/Self Care Home Management;Cryotherapy;Electrical Stimulation;Iontophoresis 55m/ml Dexamethasone;Ultrasound;Moist Heat;Therapeutic activities;Therapeutic exercise;Neuromuscular re-education;Patient/family education;Manual techniques;Passive range of motion;Taping   PT Home Exercise Plan access for d/c        Problem List There are no active problems to display for this patient.   RScot Jun PTA  05/22/2015, 10:06 AM  [facility]  [address] [place], [place], [address] Phone: [phone]   Fax:  [phone]

## 2015-05-24 ENCOUNTER — Encounter: Payer: Self-pay | Admitting: Physical Therapy

## 2015-05-24 ENCOUNTER — Ambulatory Visit: Payer: Medicare Other | Admitting: Physical Therapy

## 2015-05-24 DIAGNOSIS — M25511 Pain in right shoulder: Secondary | ICD-10-CM | POA: Diagnosis not present

## 2015-05-24 DIAGNOSIS — R29898 Other symptoms and signs involving the musculoskeletal system: Secondary | ICD-10-CM

## 2015-05-24 DIAGNOSIS — M67911 Unspecified disorder of synovium and tendon, right shoulder: Secondary | ICD-10-CM | POA: Diagnosis not present

## 2015-05-24 DIAGNOSIS — M7541 Impingement syndrome of right shoulder: Secondary | ICD-10-CM | POA: Diagnosis not present

## 2015-05-24 NOTE — Therapy (Signed)
[facility] [address], [place], [address] Phone: [phone]   Fax:  [phone]  Physical Therapy Treatment  Patient Details  Name: Roe Wilner MRN: [id number] Date of Birth: 1945/11/18 Referring Provider:  Antony Contras, MD  Encounter Date: 05/24/2015      PT End of Session - 05/24/15 0910    Visit Number 6   Date for PT Re-Evaluation 07/09/15   PT Start Time 0835   PT Stop Time 0915   PT Time Calculation (min) 40 min   Activity Tolerance Patient tolerated treatment well   Behavior During Therapy [facility] for tasks assessed/performed      History reviewed. No pertinent past medical history.  History reviewed. No pertinent past surgical history.  There were no vitals filed for this visit.  Visit Diagnosis:  Pain in joint, shoulder region, right  Weakness of shoulder      Subjective Assessment - 05/24/15 0837    Subjective Pt reports that everything is going good. Pt states that he was able to load a truck yesterday helping his son move. Pt also reports picking up a small refrigerator.    Limitations --  None    Currently in Pain? No/denies   Pain Score 0-No pain            [facility] PT Assessment - 05/24/15 0001    Strength   Overall Strength Comments R-abd 5/5, flex 5/5, ER 5/5 (slight  pain), ext/IR 5/5                     [facility] Adult PT Treatment/Exercise - 05/24/15 0001    Lumbar Exercises: Aerobic   UBE (Upper Arm Bike) 6 min foward/back constant work 30 watts   Shoulder Exercises: Standing   Horizontal ABduction 15 reps;Theraband;Both  2 sets    Theraband Level (Shoulder Horizontal ABduction) Level 4 (Blue)   External Rotation Strengthening;Both;15 reps;10 reps  2 sets    Theraband Level (Shoulder External Rotation) Other (comment)  Level 5 (Black)   Internal Rotation 15 reps;Strengthening;Both  2 sets    Theraband Level (Shoulder Internal Rotation) Level 4 (Blue)    Flexion Both;15 reps;Weights  2 sets    Shoulder Flexion Weight (lbs) 5   Row Strengthening;Both;10 reps  3 sets    Row Weight (lbs) 45   Shoulder Elevation 15 reps   Shoulder Elevation Limitations #10   Other Standing Exercises Lat pull downs, 3 sets, 10 reps, 45#   Other Standing Exercises standing bicep curl #7 15 reps 2 sets; standing straight arm pull downs #35 10 reps 3 sets ; wall push ups 15 reps 2 sets                   PT Short Term Goals - 05/22/15 1005    PT SHORT TERM GOAL #1   Title Pt will be ind with HEP   Status Achieved           PT Long Term Goals - 05/24/15 8032    PT LONG TERM GOAL #1   Title pt will be able to play 18 holes golf with 75% decreased pain   Status Achieved   PT LONG TERM GOAL #2   Title Pt will increase R shoulder strength 5/5   Status Achieved   PT LONG TERM GOAL #3   Title Pt will have full ROM with pain decreased 75% at 80-120 degrees.    Status Achieved  Plan - 02-Jun-2015 0911    Clinical Impression Statement Pt tolerated treatment well evident by no subjective reports of increased pain.Pt has met all goals and reports no issues at home.   Pt will benefit from skilled therapeutic intervention in order to improve on the following deficits Decreased activity tolerance;Decreased range of motion;Decreased strength;Hypomobility;Pain;Postural dysfunction   PT Frequency 2x / week   PT Duration 8 weeks   PT Treatment/Interventions ADLs/Self Care Home Management;Cryotherapy;Electrical Stimulation;Iontophoresis 39m/ml Dexamethasone;Ultrasound;Moist Heat;Therapeutic activities;Therapeutic exercise;Neuromuscular re-education;Patient/family education;Manual techniques;Passive range of motion;Taping   PT Next Visit Plan d/c PT           G-Codes - 0August 27, 20161411    Functional Assessment Tool Used FOTO 84% ability   Functional Limitation Other PT primary   Other PT Primary Goal Status ((C3754 At least 20 percent but  less than 40 percent impaired, limited or restricted   Other PT Primary Discharge Status ((H6067 At least 20 percent but less than 40 percent impaired, limited or restricted      Problem List There are no active problems to display for this patient.   RRuss Halo PTA 8Aug 27, 2016 2:12 PM  [place] [address]Phone: [phone]  Fax:  [phone]  PHYSICAL THERAPY DISCHARGE SUMMARY  Visits from Start of Care: 6 Current functional level related to goals / functional outcomes: See above   Remaining deficits: none   Education / Equipment: HEP Plan: Patient agrees to discharge.  Patient goals were met. Patient is being discharged due to meeting the stated rehab goals.  ?????       SJeral Pinch PT [id number] PM

## 2015-09-19 DIAGNOSIS — H40003 Preglaucoma, unspecified, bilateral: Secondary | ICD-10-CM | POA: Diagnosis not present

## 2015-09-19 DIAGNOSIS — H43813 Vitreous degeneration, bilateral: Secondary | ICD-10-CM | POA: Diagnosis not present

## 2015-09-19 DIAGNOSIS — H26493 Other secondary cataract, bilateral: Secondary | ICD-10-CM | POA: Diagnosis not present

## 2015-09-21 DIAGNOSIS — Z23 Encounter for immunization: Secondary | ICD-10-CM | POA: Diagnosis not present

## 2016-01-29 DIAGNOSIS — L600 Ingrowing nail: Secondary | ICD-10-CM | POA: Diagnosis not present

## 2016-02-01 ENCOUNTER — Encounter: Payer: Self-pay | Admitting: Podiatry

## 2016-02-01 ENCOUNTER — Ambulatory Visit (INDEPENDENT_AMBULATORY_CARE_PROVIDER_SITE_OTHER): Payer: Medicare Other | Admitting: Podiatry

## 2016-02-01 VITALS — BP 124/82 | HR 77 | Resp 16 | Ht 69.0 in | Wt 210.0 lb

## 2016-02-01 DIAGNOSIS — L03031 Cellulitis of right toe: Secondary | ICD-10-CM

## 2016-02-01 DIAGNOSIS — L03012 Cellulitis of left finger: Secondary | ICD-10-CM

## 2016-02-01 DIAGNOSIS — L600 Ingrowing nail: Secondary | ICD-10-CM

## 2016-02-01 NOTE — Progress Notes (Signed)
   Subjective:    Patient ID: Philip Garner, male    DOB: 03/04/1946, 70 y.o.   MRN: [id number]  HPI Chief Complaint  Patient presents with  . Nail Problem    Left foot; great toe-lateral; pt stated, "Went to general practitioner on 01/29/16; is on Amoxicillin 875 mg, bid; soaking toe with salt water, with some relief"; x2 weeks     Review of Systems  All other systems reviewed and are negative.      Objective:   Physical Exam        Assessment & Plan:

## 2016-02-01 NOTE — Patient Instructions (Addendum)

## 2016-02-04 NOTE — Progress Notes (Signed)
Subjective:     Patient ID: Philip Garner, male   DOB: Jan 24, 1946, 70 y.o.   MRN: [id number]  HPI patient states he's had trouble with his ingrown toenail over the last few weeks and did have an infection and is on amoxicillin from his family physician. States it's still very tender and makes it hard to wear shoe gear comfortably   Review of Systems  All other systems reviewed and are negative.      Objective:   Physical Exam  Constitutional: He is oriented to person, place, and time.  Cardiovascular: Intact distal pulses.   Musculoskeletal: Normal range of motion.  Neurological: He is oriented to person, place, and time.  Skin: Skin is warm.  Nursing note and vitals reviewed.  neurovascular status intact muscle strength adequate range of motion within normal limits with patient found to have an inflamed left hallux lateral border that's painful when pressed with distal redness and minimal drainage but not active at the current time. Patient is noted to have good digital perfusion and is well oriented 3     Assessment:     Ingrown toenail deformity left hallux with mild distal paronychia infection localized in nature    Plan:     H&P condition discussed and the fact he is on antibiotics reviewed. Today I went ahead and I infiltrated 60 mg I can Marcaine mixture remove the lateral border removed all proud flesh abscess tissue and applied phenol to the base 3 applications 30 seconds followed by alcohol lavage and sterile dressings. Gave instructions on soaks and reappoint

## 2016-02-12 ENCOUNTER — Telehealth: Payer: Self-pay | Admitting: *Deleted

## 2016-02-12 NOTE — Telephone Encounter (Signed)
Called patient at [phone] (Home #) to check to see how they were doing from their Paronychia procedure that was performed on Friday, February 01, 2016. Pt stated, "Toe is all healed up and has no pain".

## 2016-02-18 DIAGNOSIS — K219 Gastro-esophageal reflux disease without esophagitis: Secondary | ICD-10-CM | POA: Diagnosis not present

## 2016-02-18 DIAGNOSIS — H409 Unspecified glaucoma: Secondary | ICD-10-CM | POA: Diagnosis not present

## 2016-02-18 DIAGNOSIS — E039 Hypothyroidism, unspecified: Secondary | ICD-10-CM | POA: Diagnosis not present

## 2016-02-18 DIAGNOSIS — M1711 Unilateral primary osteoarthritis, right knee: Secondary | ICD-10-CM | POA: Diagnosis not present

## 2016-02-18 DIAGNOSIS — Z1211 Encounter for screening for malignant neoplasm of colon: Secondary | ICD-10-CM | POA: Diagnosis not present

## 2016-02-18 DIAGNOSIS — Z1389 Encounter for screening for other disorder: Secondary | ICD-10-CM | POA: Diagnosis not present

## 2016-02-18 DIAGNOSIS — E782 Mixed hyperlipidemia: Secondary | ICD-10-CM | POA: Diagnosis not present

## 2016-02-18 DIAGNOSIS — R7309 Other abnormal glucose: Secondary | ICD-10-CM | POA: Diagnosis not present

## 2016-02-18 DIAGNOSIS — R7303 Prediabetes: Secondary | ICD-10-CM | POA: Diagnosis not present

## 2016-02-18 DIAGNOSIS — Z0000 Encounter for general adult medical examination without abnormal findings: Secondary | ICD-10-CM | POA: Diagnosis not present

## 2016-02-18 DIAGNOSIS — I10 Essential (primary) hypertension: Secondary | ICD-10-CM | POA: Diagnosis not present

## 2016-02-18 DIAGNOSIS — Z125 Encounter for screening for malignant neoplasm of prostate: Secondary | ICD-10-CM | POA: Diagnosis not present

## 2016-02-18 DIAGNOSIS — J309 Allergic rhinitis, unspecified: Secondary | ICD-10-CM | POA: Diagnosis not present

## 2016-02-26 DIAGNOSIS — I10 Essential (primary) hypertension: Secondary | ICD-10-CM | POA: Diagnosis not present

## 2016-02-26 DIAGNOSIS — N529 Male erectile dysfunction, unspecified: Secondary | ICD-10-CM | POA: Diagnosis not present

## 2016-03-11 DIAGNOSIS — H40003 Preglaucoma, unspecified, bilateral: Secondary | ICD-10-CM | POA: Diagnosis not present

## 2016-03-28 DIAGNOSIS — H26493 Other secondary cataract, bilateral: Secondary | ICD-10-CM | POA: Diagnosis not present

## 2016-04-11 DIAGNOSIS — H26493 Other secondary cataract, bilateral: Secondary | ICD-10-CM | POA: Diagnosis not present

## 2016-04-11 DIAGNOSIS — H40003 Preglaucoma, unspecified, bilateral: Secondary | ICD-10-CM | POA: Diagnosis not present

## 2016-06-16 ENCOUNTER — Other Ambulatory Visit: Payer: Self-pay | Admitting: Family Medicine

## 2016-06-16 DIAGNOSIS — Z23 Encounter for immunization: Secondary | ICD-10-CM | POA: Diagnosis not present

## 2016-06-16 DIAGNOSIS — K4090 Unilateral inguinal hernia, without obstruction or gangrene, not specified as recurrent: Secondary | ICD-10-CM

## 2016-06-18 ENCOUNTER — Ambulatory Visit
Admission: RE | Admit: 2016-06-18 | Discharge: 2016-06-18 | Disposition: A | Discharge status: Home / Self Care | Payer: Medicare Other | Source: Ambulatory Visit | Attending: Family Medicine | Admitting: Family Medicine

## 2016-06-18 DIAGNOSIS — K4090 Unilateral inguinal hernia, without obstruction or gangrene, not specified as recurrent: Secondary | ICD-10-CM | POA: Diagnosis not present

## 2016-07-01 ENCOUNTER — Ambulatory Visit: Payer: Self-pay | Admitting: Surgery

## 2016-07-01 DIAGNOSIS — K4090 Unilateral inguinal hernia, without obstruction or gangrene, not specified as recurrent: Secondary | ICD-10-CM | POA: Diagnosis not present

## 2016-07-01 DIAGNOSIS — Z01818 Encounter for other preprocedural examination: Secondary | ICD-10-CM | POA: Diagnosis not present

## 2016-07-01 NOTE — H&P (Signed)
Erlinda Hong 07/01/2016 10:26 AM Location: [facility] Patient #: [id number] DOB: Feb 04, 1946 Widowed / Language: Cleophus Molt / Race: White Male  History of Present Illness Adin Hector MD; 07/01/2016 10:58 AM) The patient is a 70 year old male who presents with an inguinal hernia. Note for "Inguinal hernia": Patient sent for surgical consultation by primary care physician Dr. Moreen Fowler. Concern for left inguinal hernia.  70 year old active male who is had some groin swelling on the left side. Swelling the past year. Now pain there. Concern possibly was a lipoma. Ultrasound were suspicious for inguinal hernia. Surgical consultation requested. He recalls hernia repair done about 40 years ago. He cannot recall which side, but he thinks it was the other side, the right. BM daily. Quit smoking in 1980s. ASA 81mg  only. Exercising regularly. Appy in 1950s   Other Problems Illene Regulus, CMA; 07/01/2016 10:26 AM) Arthritis Bladder Problems Gastroesophageal Reflux Disease Hemorrhoids High blood pressure Hypercholesterolemia Thyroid Disease Umbilical Hernia Repair Ventral Hernia Repair  Past Surgical History Lars Mage Spillers, CMA; 07/01/2016 10:26 AM) Appendectomy Cataract Surgery Bilateral. Colon Polyp Removal - Colonoscopy Hemorrhoidectomy Knee Surgery Right. Laparoscopic Inguinal Hernia Surgery Right. Oral Surgery Tonsillectomy  Diagnostic Studies History Illene Regulus, CMA; 07/01/2016 10:26 AM) Colonoscopy 1-5 years ago  Medication History Lars Mage Spillers, CMA; 07/01/2016 10:29 AM) AmLODIPine Besylate (10MG  Tablet, Oral) Active. Latanoprost (0.005% Solution, Ophthalmic) Active. Levothyroxine Sodium (50MCG Tablet, Oral) Active. Lovastatin (40MG  Tablet, Oral) Active. RaNITidine HCl (150MG  Tablet, Oral) Active. Anaprox (275MG  Tablet, Oral) Active. Baby Aspirin (81MG  Tablet Chewable, Oral) Active. Omega 3 500 (500MG  Capsule, Oral)  Active. Medications Reconciled  Social History Illene Regulus, CMA; 07/01/2016 10:26 AM) Alcohol use Moderate alcohol use. Caffeine use Carbonated beverages, Coffee, Tea. No drug use Tobacco use Current some day smoker.  Family History Illene Regulus, [place]; 07/01/2016 10:26 AM) Arthritis Mother. Cancer Mother, Sister. Cerebrovascular Accident Father, Sister. Colon Polyps Sister. Depression Sister. Diabetes Mellitus Father. Heart Disease Father. Hypertension Brother, Sister. Migraine Headache Mother, Sister. Respiratory Condition Mother. Seizure disorder Sister. Thyroid problems Mother.     Review of Systems Lars Mage Spillers CMA; 07/01/2016 10:26 AM) General Not Present- Appetite Loss, Chills, Fatigue, Fever, Night Sweats, Weight Gain and Weight Loss. Skin Not Present- Change in Wart/Mole, Dryness, Hives, Jaundice, New Lesions, Non-Healing Wounds, Rash and Ulcer. HEENT Present- Wears glasses/contact lenses. Not Present- Earache, Hearing Loss, Hoarseness, Nose Bleed, Oral Ulcers, Ringing in the Ears, Seasonal Allergies, Sinus Pain, Sore Throat, Visual Disturbances and Yellow Eyes. Respiratory Not Present- Bloody sputum, Chronic Cough, Difficulty Breathing, Snoring and Wheezing. Breast Not Present- Breast Mass, Breast Pain, Nipple Discharge and Skin Changes. Cardiovascular Not Present- Chest Pain, Difficulty Breathing Lying Down, Leg Cramps, Palpitations, Rapid Heart Rate, Shortness of Breath and Swelling of Extremities. Gastrointestinal Present- Abdominal Pain, Hemorrhoids and Indigestion. Not Present- Bloating, Bloody Stool, Change in Bowel Habits, Chronic diarrhea, Constipation, Difficulty Swallowing, Excessive gas, Gets full quickly at meals, Nausea, Rectal Pain and Vomiting. Male Genitourinary Present- Frequency, Impotence and Nocturia. Not Present- Blood in Urine, Change in Urinary Stream, Painful Urination, Urgency and Urine Leakage. Musculoskeletal Present-  Joint Pain and Joint Stiffness. Not Present- Back Pain, Muscle Pain, Muscle Weakness and Swelling of Extremities. Neurological Not Present- Decreased Memory, Fainting, Headaches, Numbness, Seizures, Tingling, Tremor, Trouble walking and Weakness. Psychiatric Not Present- Anxiety, Bipolar, Change in Sleep Pattern, Depression, Fearful and Frequent crying. Endocrine Not Present- Cold Intolerance, Excessive Hunger, Hair Changes, Heat Intolerance, Hot flashes and New Diabetes. Hematology Not Present- Blood Thinners, Easy Bruising, Excessive bleeding, Gland problems,  HIV and Persistent Infections.  Vitals (Alisha Spillers CMA; 07/01/2016 10:27 AM) 07/01/2016 10:26 AM Weight: 197 lb Height: 69in Body Surface Area: 2.05 m Body Mass Index: 29.09 kg/m  Pulse: 86 (Regular)  BP: 138/80 (Sitting, Left Arm, Standard)      Physical Exam Adin Hector MD; 07/01/2016 10:56 AM)  General Mental Status-Alert. General Appearance-Not in acute distress, Not Sickly. Orientation-Oriented X3. Hydration-Well hydrated. Voice-Normal.  Integumentary Global Assessment Upon inspection and palpation of skin surfaces of the - Axillae: non-tender, no inflammation or ulceration, no drainage. and Distribution of scalp and body hair is normal. General Characteristics Temperature - normal warmth is noted.  Head and Neck Head-normocephalic, atraumatic with no lesions or palpable masses. Face Global Assessment - atraumatic, no absence of expression. Neck Global Assessment - no abnormal movements, no bruit auscultated on the right, no bruit auscultated on the left, no decreased range of motion, non-tender. Trachea-midline. Thyroid Gland Characteristics - non-tender.  Eye Eyeball - Left-Extraocular movements intact, No Nystagmus. Eyeball - Right-Extraocular movements intact, No Nystagmus. Cornea - Left-No Hazy. Cornea - Right-No Hazy. Sclera/Conjunctiva - Left-No scleral  icterus, No Discharge. Sclera/Conjunctiva - Right-No scleral icterus, No Discharge. Pupil - Left-Direct reaction to light normal. Pupil - Right-Direct reaction to light normal.  ENMT Ears Pinna - Left - no drainage observed, no generalized tenderness observed. Right - no drainage observed, no generalized tenderness observed. Nose and Sinuses External Inspection of the Nose - no destructive lesion observed. Inspection of the nares - Left - quiet respiration. Right - quiet respiration. Mouth and Throat Lips - Upper Lip - no fissures observed, no pallor noted. Lower Lip - no fissures observed, no pallor noted. Nasopharynx - no discharge present. Oral Cavity/Oropharynx - Tongue - no dryness observed. Oral Mucosa - no cyanosis observed. Hypopharynx - no evidence of airway distress observed.  Chest and Lung Exam Inspection Movements - Normal and Symmetrical. Accessory muscles - No use of accessory muscles in breathing. Palpation Palpation of the chest reveals - Non-tender. Auscultation Breath sounds - Normal and Clear.  Cardiovascular Auscultation Rhythm - Regular. Murmurs & Other Heart Sounds - Auscultation of the heart reveals - No Murmurs and No Systolic Clicks.  Abdomen Inspection Inspection of the abdomen reveals - No Visible peristalsis and No Abnormal pulsations. Umbilicus - No Bleeding, No Urine drainage. Palpation/Percussion Palpation and Percussion of the abdomen reveal - Soft, Non Tender, No Rebound tenderness, No Rigidity (guarding) and No Cutaneous hyperesthesia.  Male Genitourinary Sexual Maturity Tanner 5 - Adult hair pattern and Adult penile size and shape. Note: Left inguinal hernia reducible. Seems more lateral/indirect. Possible incision at right groin. No evidence of any right inguinal hernia. Normal external genitalia. Epididymi, testes, and spermatic cords normal without any masses.  Peripheral Vascular Upper Extremity Inspection - Left - No Cyanotic  nailbeds, Not Ischemic. Right - No Cyanotic nailbeds, Not Ischemic.  Neurologic Neurologic evaluation reveals -normal attention span and ability to concentrate, able to name objects and repeat phrases. Appropriate fund of knowledge , normal sensation and normal coordination. Mental Status Affect - not angry, not paranoid. Cranial Nerves-Normal Bilaterally. Gait-Normal.  Neuropsychiatric Mental status exam performed with findings of-able to articulate well with normal speech/language, rate, volume and coherence, thought content normal with ability to perform basic computations and apply abstract reasoning and no evidence of hallucinations, delusions, obsessions or homicidal/suicidal ideation.  Musculoskeletal Global Assessment Spine, Ribs and Pelvis - no instability, subluxation or laxity. Right Upper Extremity - no instability, subluxation or laxity.  Lymphatic Head &  Neck  General Head & Neck Lymphatics: Bilateral - Description - No Localized lymphadenopathy. Axillary  General Axillary Region: Bilateral - Description - No Localized lymphadenopathy. Femoral & Inguinal  Generalized Femoral & Inguinal Lymphatics: Left - Description - No Localized lymphadenopathy. Right - Description - No Localized lymphadenopathy.    Assessment & Plan Adin Hector MD; 07/01/2016 10:58 AM)  LEFT INGUINAL HERNIA (K40.90) Impression: Definite left inguinal hernia increasing in size and now becoming symptomatic. I think he would benefit from surgical repair. We'll plan laparoscopic exploration. No hard evidence of a right inguinal hernia but we'll check that side just case.  He is going on a cruise soon & is hoping to get this done so he does not have problems with that. We will work to coordinate a convenient time.  PREOP - ING HERNIA - ENCOUNTER FOR PREOPERATIVE EXAMINATION FOR GENERAL SURGICAL PROCEDURE (Z01.818)  Current Plans You are being scheduled for surgery - Our schedulers will  call you.  You should hear from our office's scheduling department within 5 working days about the location, date, and time of surgery. We try to make accommodations for patient's preferences in scheduling surgery, but sometimes the OR schedule or the surgeon's schedule prevents [place] from making those accommodations.  If you have not heard from our office [phone]) in 5 working days, call the office and ask for your surgeon's nurse.  If you have other questions about your diagnosis, plan, or surgery, call the office and ask for your surgeon's nurse.  Written instructions provided The anatomy & physiology of the abdominal wall and pelvic floor was discussed. The pathophysiology of hernias in the inguinal and pelvic region was discussed. Natural history risks such as progressive enlargement, pain, incarceration, and strangulation was discussed. Contributors to complications such as smoking, obesity, diabetes, prior surgery, etc were discussed.  I feel the risks of no intervention will lead to serious problems that outweigh the operative risks; therefore, I recommended surgery to reduce and repair the hernia. I explained laparoscopic techniques with possible need for an open approach. I noted usual use of mesh to patch and/or buttress hernia repair  Risks such as bleeding, infection, abscess, need for further treatment, heart attack, death, and other risks were discussed. I noted a good likelihood this will help address the problem. Goals of post-operative recovery were discussed as well. Possibility that this will not correct all symptoms was explained. I stressed the importance of low-impact activity, aggressive pain control, avoiding constipation, & not pushing through pain to minimize risk of post-operative chronic pain or injury. Possibility of reherniation was discussed. We will work to minimize complications.  An educational handout further explaining the pathology & treatment options was given  as well. Questions were answered. The patient expresses understanding & wishes to proceed with surgery.  Pt Education - Pamphlet Given - Laparoscopic Hernia Repair: discussed with patient and provided information. Pt Education - [facility] Pain Control (Lucy Woolever) Pt Education - [facility] Hernia Post-Op HCI (Jaramie Bastos): discussed with patient and provided information.  Adin Hector, M.D., F.A.C.S. Gastrointestinal and Minimally Invasive Surgery [facility] [address], [place], [place] [address] [phone] Main / Paging

## 2016-07-11 DIAGNOSIS — K4190 Unilateral femoral hernia, without obstruction or gangrene, not specified as recurrent: Secondary | ICD-10-CM | POA: Diagnosis not present

## 2016-07-11 DIAGNOSIS — K4090 Unilateral inguinal hernia, without obstruction or gangrene, not specified as recurrent: Secondary | ICD-10-CM | POA: Diagnosis not present

## 2016-08-18 DIAGNOSIS — E782 Mixed hyperlipidemia: Secondary | ICD-10-CM | POA: Diagnosis not present

## 2016-08-18 DIAGNOSIS — K219 Gastro-esophageal reflux disease without esophagitis: Secondary | ICD-10-CM | POA: Diagnosis not present

## 2016-08-18 DIAGNOSIS — E039 Hypothyroidism, unspecified: Secondary | ICD-10-CM | POA: Diagnosis not present

## 2016-08-18 DIAGNOSIS — R7301 Impaired fasting glucose: Secondary | ICD-10-CM | POA: Diagnosis not present

## 2016-08-18 DIAGNOSIS — I10 Essential (primary) hypertension: Secondary | ICD-10-CM | POA: Diagnosis not present

## 2016-09-18 DIAGNOSIS — M170 Bilateral primary osteoarthritis of knee: Secondary | ICD-10-CM | POA: Diagnosis not present

## 2016-09-18 DIAGNOSIS — R262 Difficulty in walking, not elsewhere classified: Secondary | ICD-10-CM | POA: Diagnosis not present

## 2016-09-18 DIAGNOSIS — M25561 Pain in right knee: Secondary | ICD-10-CM | POA: Diagnosis not present

## 2016-09-18 DIAGNOSIS — M25562 Pain in left knee: Secondary | ICD-10-CM | POA: Diagnosis not present

## 2016-10-27 DIAGNOSIS — M170 Bilateral primary osteoarthritis of knee: Secondary | ICD-10-CM | POA: Diagnosis not present

## 2016-10-27 DIAGNOSIS — M25562 Pain in left knee: Secondary | ICD-10-CM | POA: Diagnosis not present

## 2016-10-27 DIAGNOSIS — M1711 Unilateral primary osteoarthritis, right knee: Secondary | ICD-10-CM | POA: Diagnosis not present

## 2016-10-27 DIAGNOSIS — M25561 Pain in right knee: Secondary | ICD-10-CM | POA: Diagnosis not present

## 2016-10-29 DIAGNOSIS — H40003 Preglaucoma, unspecified, bilateral: Secondary | ICD-10-CM | POA: Diagnosis not present

## 2016-11-04 DIAGNOSIS — M25561 Pain in right knee: Secondary | ICD-10-CM | POA: Diagnosis not present

## 2016-11-04 DIAGNOSIS — M1711 Unilateral primary osteoarthritis, right knee: Secondary | ICD-10-CM | POA: Diagnosis not present

## 2016-11-11 DIAGNOSIS — M25561 Pain in right knee: Secondary | ICD-10-CM | POA: Diagnosis not present

## 2016-11-11 DIAGNOSIS — M1711 Unilateral primary osteoarthritis, right knee: Secondary | ICD-10-CM | POA: Diagnosis not present

## 2016-11-24 DIAGNOSIS — M25561 Pain in right knee: Secondary | ICD-10-CM | POA: Diagnosis not present

## 2016-11-24 DIAGNOSIS — M1711 Unilateral primary osteoarthritis, right knee: Secondary | ICD-10-CM | POA: Diagnosis not present

## 2016-12-01 DIAGNOSIS — M1711 Unilateral primary osteoarthritis, right knee: Secondary | ICD-10-CM | POA: Diagnosis not present

## 2016-12-01 DIAGNOSIS — M25561 Pain in right knee: Secondary | ICD-10-CM | POA: Diagnosis not present

## 2017-02-19 DIAGNOSIS — E782 Mixed hyperlipidemia: Secondary | ICD-10-CM | POA: Diagnosis not present

## 2017-02-19 DIAGNOSIS — M25561 Pain in right knee: Secondary | ICD-10-CM | POA: Diagnosis not present

## 2017-02-19 DIAGNOSIS — Z0000 Encounter for general adult medical examination without abnormal findings: Secondary | ICD-10-CM | POA: Diagnosis not present

## 2017-02-19 DIAGNOSIS — R7303 Prediabetes: Secondary | ICD-10-CM | POA: Diagnosis not present

## 2017-02-19 DIAGNOSIS — N529 Male erectile dysfunction, unspecified: Secondary | ICD-10-CM | POA: Diagnosis not present

## 2017-02-19 DIAGNOSIS — L989 Disorder of the skin and subcutaneous tissue, unspecified: Secondary | ICD-10-CM | POA: Diagnosis not present

## 2017-02-19 DIAGNOSIS — K219 Gastro-esophageal reflux disease without esophagitis: Secondary | ICD-10-CM | POA: Diagnosis not present

## 2017-02-19 DIAGNOSIS — E039 Hypothyroidism, unspecified: Secondary | ICD-10-CM | POA: Diagnosis not present

## 2017-02-19 DIAGNOSIS — H409 Unspecified glaucoma: Secondary | ICD-10-CM | POA: Diagnosis not present

## 2017-02-19 DIAGNOSIS — Z125 Encounter for screening for malignant neoplasm of prostate: Secondary | ICD-10-CM | POA: Diagnosis not present

## 2017-02-19 DIAGNOSIS — I10 Essential (primary) hypertension: Secondary | ICD-10-CM | POA: Diagnosis not present

## 2017-03-19 DIAGNOSIS — R208 Other disturbances of skin sensation: Secondary | ICD-10-CM | POA: Diagnosis not present

## 2017-03-19 DIAGNOSIS — L570 Actinic keratosis: Secondary | ICD-10-CM | POA: Diagnosis not present

## 2017-03-19 DIAGNOSIS — D485 Neoplasm of uncertain behavior of skin: Secondary | ICD-10-CM | POA: Diagnosis not present

## 2017-03-19 DIAGNOSIS — L538 Other specified erythematous conditions: Secondary | ICD-10-CM | POA: Diagnosis not present

## 2017-03-19 DIAGNOSIS — L918 Other hypertrophic disorders of the skin: Secondary | ICD-10-CM | POA: Diagnosis not present

## 2017-03-19 DIAGNOSIS — D1801 Hemangioma of skin and subcutaneous tissue: Secondary | ICD-10-CM | POA: Diagnosis not present

## 2017-03-19 DIAGNOSIS — C44329 Squamous cell carcinoma of skin of other parts of face: Secondary | ICD-10-CM | POA: Diagnosis not present

## 2017-04-14 DIAGNOSIS — H26493 Other secondary cataract, bilateral: Secondary | ICD-10-CM | POA: Diagnosis not present

## 2017-04-14 DIAGNOSIS — H40003 Preglaucoma, unspecified, bilateral: Secondary | ICD-10-CM | POA: Diagnosis not present

## 2017-04-14 DIAGNOSIS — H43813 Vitreous degeneration, bilateral: Secondary | ICD-10-CM | POA: Diagnosis not present

## 2017-05-13 DIAGNOSIS — C44329 Squamous cell carcinoma of skin of other parts of face: Secondary | ICD-10-CM | POA: Diagnosis not present

## 2017-09-04 DIAGNOSIS — K219 Gastro-esophageal reflux disease without esophagitis: Secondary | ICD-10-CM | POA: Diagnosis not present

## 2017-09-04 DIAGNOSIS — N529 Male erectile dysfunction, unspecified: Secondary | ICD-10-CM | POA: Diagnosis not present

## 2017-09-04 DIAGNOSIS — I10 Essential (primary) hypertension: Secondary | ICD-10-CM | POA: Diagnosis not present

## 2017-09-04 DIAGNOSIS — E782 Mixed hyperlipidemia: Secondary | ICD-10-CM | POA: Diagnosis not present

## 2017-09-04 DIAGNOSIS — E039 Hypothyroidism, unspecified: Secondary | ICD-10-CM | POA: Diagnosis not present

## 2017-09-04 DIAGNOSIS — R7303 Prediabetes: Secondary | ICD-10-CM | POA: Diagnosis not present

## 2017-09-04 DIAGNOSIS — M25561 Pain in right knee: Secondary | ICD-10-CM | POA: Diagnosis not present

## 2017-09-04 DIAGNOSIS — Z23 Encounter for immunization: Secondary | ICD-10-CM | POA: Diagnosis not present

## 2017-09-04 DIAGNOSIS — J302 Other seasonal allergic rhinitis: Secondary | ICD-10-CM | POA: Diagnosis not present

## 2017-11-13 DIAGNOSIS — R208 Other disturbances of skin sensation: Secondary | ICD-10-CM | POA: Diagnosis not present

## 2017-11-13 DIAGNOSIS — Z85828 Personal history of other malignant neoplasm of skin: Secondary | ICD-10-CM | POA: Diagnosis not present

## 2017-11-13 DIAGNOSIS — L538 Other specified erythematous conditions: Secondary | ICD-10-CM | POA: Diagnosis not present

## 2017-11-13 DIAGNOSIS — L918 Other hypertrophic disorders of the skin: Secondary | ICD-10-CM | POA: Diagnosis not present

## 2017-11-13 DIAGNOSIS — L298 Other pruritus: Secondary | ICD-10-CM | POA: Diagnosis not present

## 2017-11-13 DIAGNOSIS — L738 Other specified follicular disorders: Secondary | ICD-10-CM | POA: Diagnosis not present

## 2017-11-13 DIAGNOSIS — D1801 Hemangioma of skin and subcutaneous tissue: Secondary | ICD-10-CM | POA: Diagnosis not present

## 2017-11-13 DIAGNOSIS — Z08 Encounter for follow-up examination after completed treatment for malignant neoplasm: Secondary | ICD-10-CM | POA: Diagnosis not present

## 2017-12-31 DIAGNOSIS — E782 Mixed hyperlipidemia: Secondary | ICD-10-CM | POA: Diagnosis not present

## 2017-12-31 DIAGNOSIS — I10 Essential (primary) hypertension: Secondary | ICD-10-CM | POA: Diagnosis not present

## 2017-12-31 DIAGNOSIS — M25561 Pain in right knee: Secondary | ICD-10-CM | POA: Diagnosis not present

## 2017-12-31 DIAGNOSIS — E039 Hypothyroidism, unspecified: Secondary | ICD-10-CM | POA: Diagnosis not present

## 2017-12-31 DIAGNOSIS — R7303 Prediabetes: Secondary | ICD-10-CM | POA: Diagnosis not present

## 2017-12-31 DIAGNOSIS — J302 Other seasonal allergic rhinitis: Secondary | ICD-10-CM | POA: Diagnosis not present

## 2017-12-31 DIAGNOSIS — K219 Gastro-esophageal reflux disease without esophagitis: Secondary | ICD-10-CM | POA: Diagnosis not present

## 2017-12-31 DIAGNOSIS — N529 Male erectile dysfunction, unspecified: Secondary | ICD-10-CM | POA: Diagnosis not present

## 2018-03-04 IMAGING — US US PELVIS LIMITED
1 series · 6 of 6 positions shown · non-contrast
Comparison: None.

CLINICAL DATA: Left inguinal pain 2 months.

EXAM:
LIMITED ULTRASOUND OF PELVIS
TECHNIQUE: Limited transabdominal ultrasound examination of the pelvis was
performed.

[Series 1: us pelvis limited · 6 acquisitions, 6 frames shown]
[im 1/6]
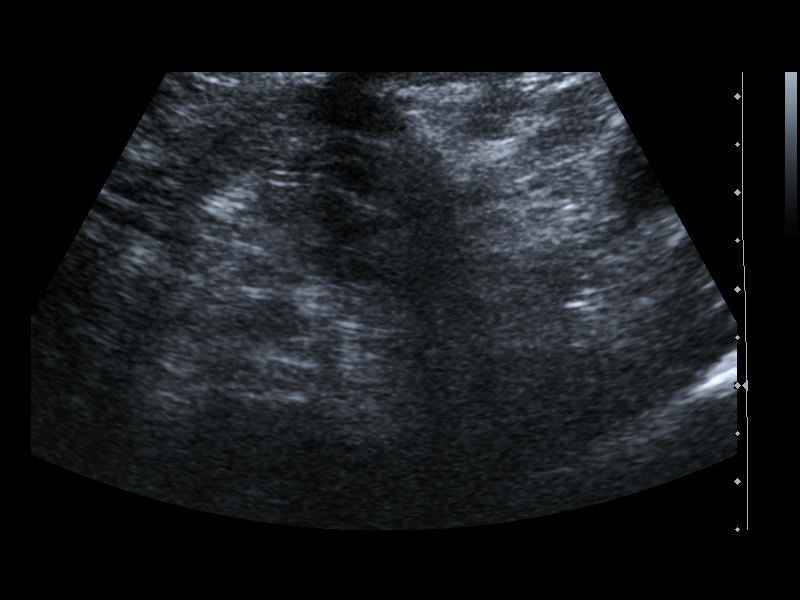
[im 2/6]
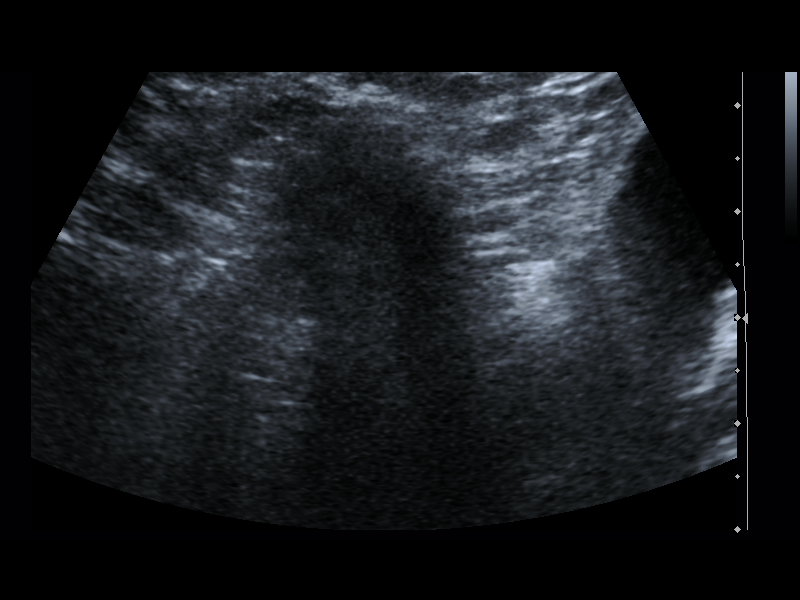
[im 3/6]
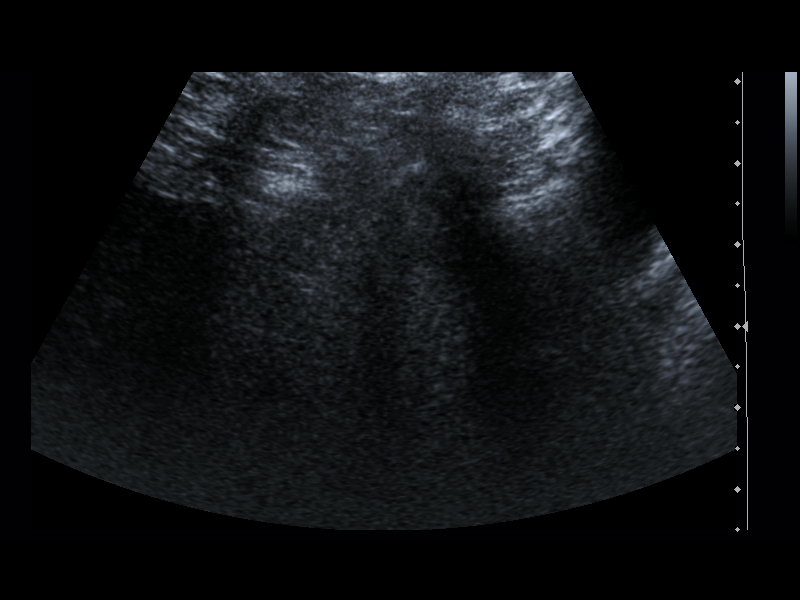
[im 4/6]
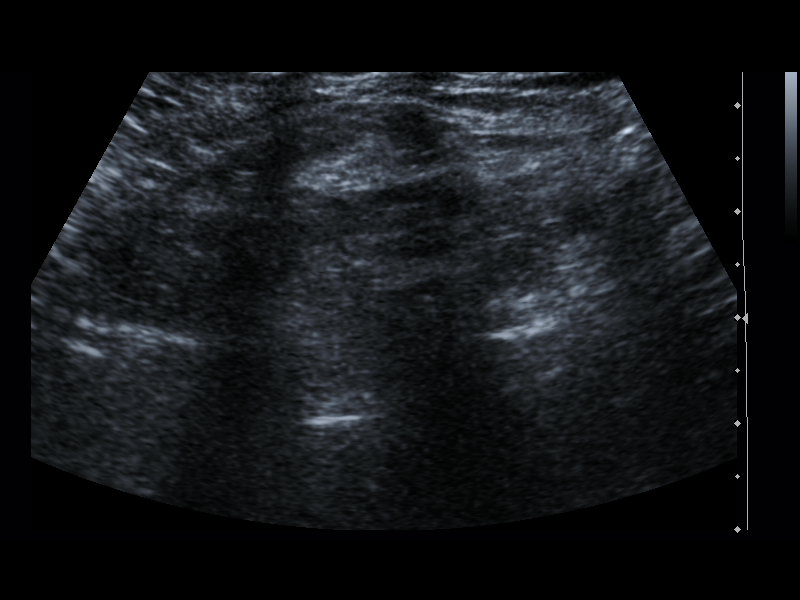
[im 5/6]
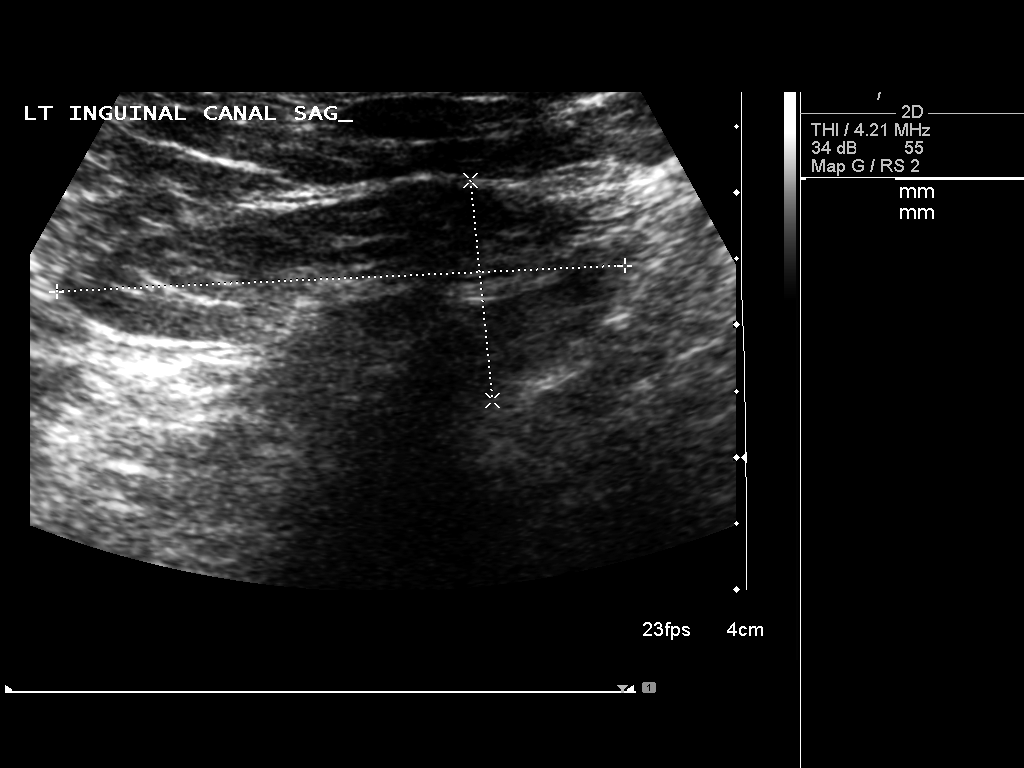
[im 6/6]
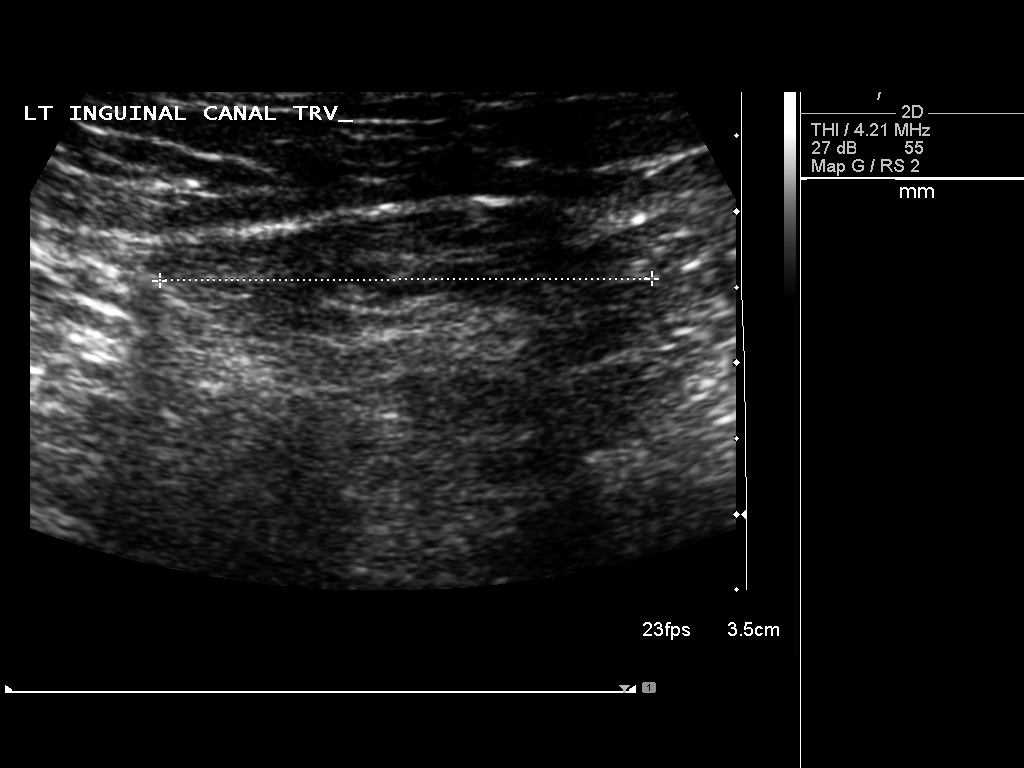

[6 of 6 positions shown; findings below may reference images not displayed]

FINDINGS: Ultrasound examination over the left inguinal region demonstrates
somewhat heterogeneous material filling the left inguinal canal
which is worse with Valsalva. Some of the contents appear to
represent bowel loops. Findings are compatible with a left inguinal
hernia containing several bowel loops.
IMPRESSION: Ultrasound examination over the left inguinal region demonstrates a
left inguinal hernia containing segments of bowel loops.

## 2018-10-22 DIAGNOSIS — H401133 Primary open-angle glaucoma, bilateral, severe stage: Secondary | ICD-10-CM | POA: Diagnosis not present

## 2018-12-27 DIAGNOSIS — Z1331 Encounter for screening for depression: Secondary | ICD-10-CM | POA: Diagnosis not present

## 2018-12-27 DIAGNOSIS — R319 Hematuria, unspecified: Secondary | ICD-10-CM | POA: Diagnosis not present

## 2018-12-27 DIAGNOSIS — Z6828 Body mass index (BMI) 28.0-28.9, adult: Secondary | ICD-10-CM | POA: Diagnosis not present

## 2018-12-27 DIAGNOSIS — I10 Essential (primary) hypertension: Secondary | ICD-10-CM | POA: Diagnosis not present

## 2018-12-27 DIAGNOSIS — E039 Hypothyroidism, unspecified: Secondary | ICD-10-CM | POA: Diagnosis not present

## 2018-12-27 DIAGNOSIS — F17210 Nicotine dependence, cigarettes, uncomplicated: Secondary | ICD-10-CM | POA: Diagnosis not present

## 2018-12-27 DIAGNOSIS — Z1321 Encounter for screening for nutritional disorder: Secondary | ICD-10-CM | POA: Diagnosis not present

## 2018-12-27 DIAGNOSIS — Z0000 Encounter for general adult medical examination without abnormal findings: Secondary | ICD-10-CM | POA: Diagnosis not present

## 2018-12-27 DIAGNOSIS — Z125 Encounter for screening for malignant neoplasm of prostate: Secondary | ICD-10-CM | POA: Diagnosis not present

## 2018-12-27 DIAGNOSIS — E785 Hyperlipidemia, unspecified: Secondary | ICD-10-CM | POA: Diagnosis not present

## 2019-01-12 DIAGNOSIS — Z08 Encounter for follow-up examination after completed treatment for malignant neoplasm: Secondary | ICD-10-CM | POA: Diagnosis not present

## 2019-01-12 DIAGNOSIS — Z85828 Personal history of other malignant neoplasm of skin: Secondary | ICD-10-CM | POA: Diagnosis not present

## 2019-01-12 DIAGNOSIS — D485 Neoplasm of uncertain behavior of skin: Secondary | ICD-10-CM | POA: Diagnosis not present

## 2019-03-08 DIAGNOSIS — D1801 Hemangioma of skin and subcutaneous tissue: Secondary | ICD-10-CM | POA: Diagnosis not present

## 2019-03-08 DIAGNOSIS — L814 Other melanin hyperpigmentation: Secondary | ICD-10-CM | POA: Diagnosis not present

## 2019-03-08 DIAGNOSIS — L570 Actinic keratosis: Secondary | ICD-10-CM | POA: Diagnosis not present

## 2019-04-11 DIAGNOSIS — R8290 Unspecified abnormal findings in urine: Secondary | ICD-10-CM | POA: Diagnosis not present

## 2019-07-15 DIAGNOSIS — Z23 Encounter for immunization: Secondary | ICD-10-CM | POA: Diagnosis not present

## 2019-08-01 DIAGNOSIS — H401131 Primary open-angle glaucoma, bilateral, mild stage: Secondary | ICD-10-CM | POA: Diagnosis not present

## 2019-08-26 DIAGNOSIS — H401131 Primary open-angle glaucoma, bilateral, mild stage: Secondary | ICD-10-CM | POA: Diagnosis not present
